# Patient Record
Sex: Male | Born: 1985 | Race: White | Hispanic: No | Marital: Single | Smoking: Current every day smoker
Health system: Southern US, Community
[De-identification: ages and names within clinical notes are randomized; demographics above are authoritative.]

---
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
## 2003-12-13 ENCOUNTER — Emergency Department (HOSPITAL_COMMUNITY): Admission: EM | Admit: 2003-12-13 | Discharge: 2003-12-13 | Payer: Self-pay | Admitting: Emergency Medicine

## 2003-12-25 ENCOUNTER — Encounter: Admission: RE | Admit: 2003-12-25 | Discharge: 2003-12-25 | Payer: Self-pay | Admitting: Orthopaedic Surgery

## 2004-08-09 ENCOUNTER — Inpatient Hospital Stay (HOSPITAL_COMMUNITY): Admission: EM | Admit: 2004-08-09 | Discharge: 2004-08-10 | Payer: Self-pay | Admitting: Podiatry

## 2006-03-27 ENCOUNTER — Emergency Department (HOSPITAL_COMMUNITY): Admission: EM | Admit: 2006-03-27 | Discharge: 2006-03-27 | Payer: Self-pay | Admitting: Emergency Medicine

## 2008-01-20 ENCOUNTER — Emergency Department (HOSPITAL_COMMUNITY): Admission: EM | Admit: 2008-01-20 | Discharge: 2008-01-20 | Payer: Self-pay | Admitting: Emergency Medicine
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
## 2010-07-31 NOTE — H&P (Signed)
NAME:  Travis Hardin, Travis Hardin                 ACCOUNT NO.:  [id number]   MEDICAL RECORD NO.:  [id number]          PATIENT TYPE:  INP   LOCATION:  3313                         FACILITY:  [facility]   PHYSICIAN:  Coletta Memos, M.D.     DATE OF BIRTH:  [birth year]   DATE OF ADMISSION:  08/09/2004  DATE OF DISCHARGE:                                HISTORY & PHYSICAL   Audio too short to transcribe (less than 5 seconds)      KC/MEDQ  D:  08/10/2004  T:  08/10/2004  Job:  [id number]
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
## 2013-05-07 ENCOUNTER — Emergency Department (HOSPITAL_COMMUNITY)
Admission: EM | Admit: 2013-05-07 | Discharge: 2013-05-07 | Disposition: A | Discharge status: Home / Self Care | Payer: Self-pay | Attending: Emergency Medicine | Admitting: Emergency Medicine

## 2013-05-07 ENCOUNTER — Encounter (HOSPITAL_COMMUNITY): Payer: Self-pay | Admitting: Emergency Medicine

## 2013-05-07 ENCOUNTER — Emergency Department (HOSPITAL_COMMUNITY): Payer: Self-pay

## 2013-05-07 DIAGNOSIS — F17200 Nicotine dependence, unspecified, uncomplicated: Secondary | ICD-10-CM | POA: Insufficient documentation

## 2013-05-07 DIAGNOSIS — IMO0002 Reserved for concepts with insufficient information to code with codable children: Secondary | ICD-10-CM

## 2013-05-07 DIAGNOSIS — Y929 Unspecified place or not applicable: Secondary | ICD-10-CM | POA: Insufficient documentation

## 2013-05-07 DIAGNOSIS — S81009A Unspecified open wound, unspecified knee, initial encounter: Secondary | ICD-10-CM | POA: Insufficient documentation

## 2013-05-07 DIAGNOSIS — W278XXA Contact with other nonpowered hand tool, initial encounter: Secondary | ICD-10-CM | POA: Insufficient documentation

## 2013-05-07 DIAGNOSIS — Y9389 Activity, other specified: Secondary | ICD-10-CM | POA: Insufficient documentation

## 2013-05-07 DIAGNOSIS — S81809A Unspecified open wound, unspecified lower leg, initial encounter: Principal | ICD-10-CM

## 2013-05-07 DIAGNOSIS — Z23 Encounter for immunization: Secondary | ICD-10-CM | POA: Insufficient documentation

## 2013-05-07 DIAGNOSIS — S91009A Unspecified open wound, unspecified ankle, initial encounter: Principal | ICD-10-CM

## 2013-05-07 MED ORDER — HYDROCODONE-ACETAMINOPHEN 5-325 MG PO TABS
1.0000 | ORAL_TABLET | Freq: Four times a day (QID) | ORAL | Status: AC | PRN
Start: 2013-05-07 — End: ?

## 2013-05-07 MED ORDER — CEPHALEXIN 500 MG PO CAPS: 500.0000 mg | ORAL_CAPSULE | Freq: Four times a day (QID) | ORAL | Status: AC

## 2013-05-07 MED ORDER — TETANUS-DIPHTH-ACELL PERTUSSIS 5-2.5-18.5 LF-MCG/0.5 IM SUSP
0.5000 mL | Freq: Once | INTRAMUSCULAR | Status: AC
  Administered 2013-05-07: 0.5 mL via INTRAMUSCULAR
  Filled 2013-05-07: qty 0.5

## 2013-05-07 MED ORDER — HYDROCODONE-ACETAMINOPHEN 5-325 MG PO TABS
2.0000 | ORAL_TABLET | Freq: Once | ORAL | Status: AC
  Administered 2013-05-07: 2 via ORAL
  Filled 2013-05-07: qty 2

## 2013-05-07 NOTE — ED Provider Notes (Signed)
Medical screening examination/treatment/procedure(s) were performed by non-physician practitioner and as supervising physician I was immediately available for consultation/collaboration.  EKG Interpretation   None         Marypat Kimmet L Rudra Hobbins, MD 05/07/13 1523 

## 2013-05-07 NOTE — Discharge Instructions (Signed)

## 2013-05-07 NOTE — ED Provider Notes (Signed)
CSN: [id number]     Arrival date & time 05/07/13  [id number] History   First MD Initiated Contact with Patient 05/07/13 [phone]     Chief Complaint  Patient presents with  . Extremity Laceration     (Consider location/radiation/quality/duration/timing/severity/associated sxs/prior Treatment) HPI Comments: Patient presents to the emergency department with chief complaint of laceration to his left knee. He states that he was using a chainsaw last night, when the saw bounced back and hit his left knee. He complains of a 2 inch laceration. Bleeding is controlled. States that he used Betadine and peroxide to clean the wound at home. States that this morning when he awoke, the knee pain was moderate to severe, so he decided to come in and be evaluated. Last tetanus shot is unknown. Bending the knee makes pain worse, rest makes it better. He has not tried taking anything to alleviate his symptoms.  The history is provided by the patient. No language interpreter was used.    History reviewed. No pertinent past medical history. History reviewed. No pertinent past surgical history. No family history on file. History  Substance Use Topics  . Smoking status: Current Every Day Smoker -- 0.50 packs/day    Types: Cigarettes  . Smokeless tobacco: Not on file  . Alcohol Use: Yes    Review of Systems  Constitutional: Negative for fever.  Musculoskeletal: Positive for arthralgias.  Skin: Positive for wound. Negative for color change, pallor and rash.      Allergies  Review of patient's allergies indicates no known allergies.  Home Medications  No current outpatient prescriptions on file. BP 129/75  Pulse 93  Temp(Src) 97.8 F (36.6 C) (Oral)  Resp 18  Ht 5\' 9"  (1.753 m)  Wt 165 lb (74.844 kg)  BMI 24.36 kg/m2  SpO2 100% Physical Exam  Nursing note and vitals reviewed. Constitutional: He is oriented to person, place, and time. He appears well-developed and well-nourished.  HENT:  Head:  Normocephalic and atraumatic.  Eyes: Conjunctivae and EOM are normal.  Neck: Normal range of motion.  Cardiovascular: Normal rate and intact distal pulses.   Pulmonary/Chest: Effort normal.  Abdominal: He exhibits no distension.  Musculoskeletal: Normal range of motion.  Left knee tender to palpation over the laceration, increased pain with range of motion, strength is 5/5  Neurological: He is alert and oriented to person, place, and time.  Skin: Skin is dry.  2-3 inch laceration to the left anterior knee, no foreign bodies, no obvious tendon involvement  Psychiatric: He has a normal mood and affect. His behavior is normal. Judgment and thought content normal.    ED Course  Procedures (including critical care time) Labs Review Labs Reviewed - No data to display Imaging Review No results found.  EKG Interpretation   None       MDM   Final diagnoses:  Laceration    Patient with a laceration to the left knee. The laceration happened approximately 13 hours ago. I will not close this with sutures due to increased risk of infection. I will apply a Steri-Strip, thoroughly irrigate the wound, and check a plain film. Will update patient's tetanus.  Plain films are negative. No tendon involvement. Wound was thoroughly cleansed. Antibiotic ointment applied. Return precautions are given. Discharge with Keflex.    Roxy Horsemanobert Nayara Taplin, PA-C 05/07/13 [phone]

## 2013-05-07 NOTE — ED Notes (Signed)
Patient states he was working last night and the chainsaw bounced back and hit his L knee.    L knee laceration approximately 2 inches long.   Patient states he used betadine and peroxide at home to clean the wound.

## 2013-07-13 DEATH — deceased

## 2014-12-01 IMAGING — CR DG KNEE COMPLETE 4+V*L*
5 series · 5 of 5 positions shown · non-contrast
Comparison: None.

CLINICAL DATA: Laceration anterior left knee 2 days ago.  Pain.

EXAM:
LEFT KNEE - COMPLETE 4+ VIEW

[x knee ap left]
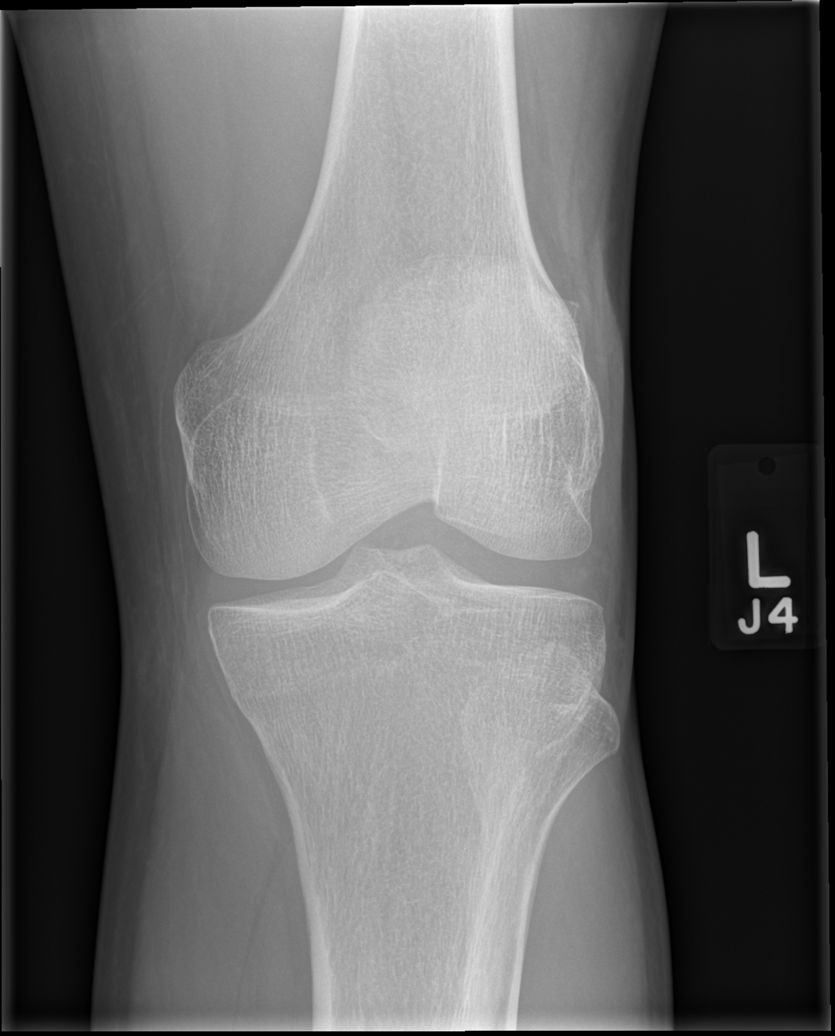

[x knee obl left (1 of 2)]
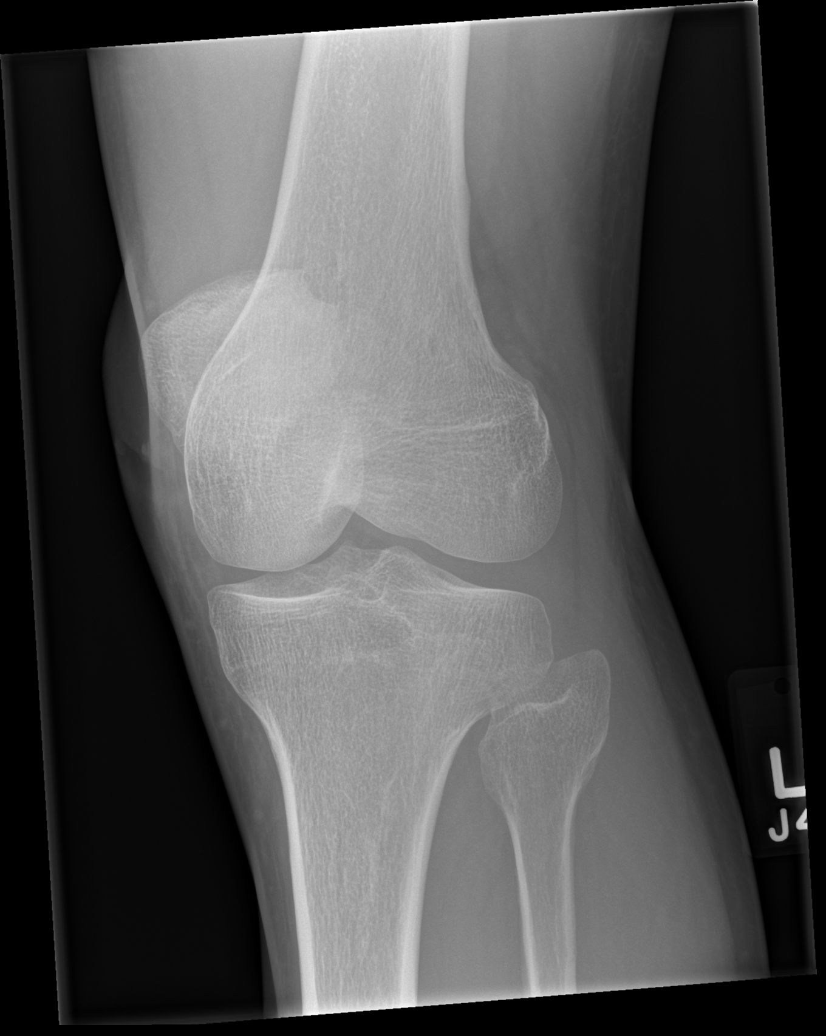

[x knee obl left (2 of 2)]
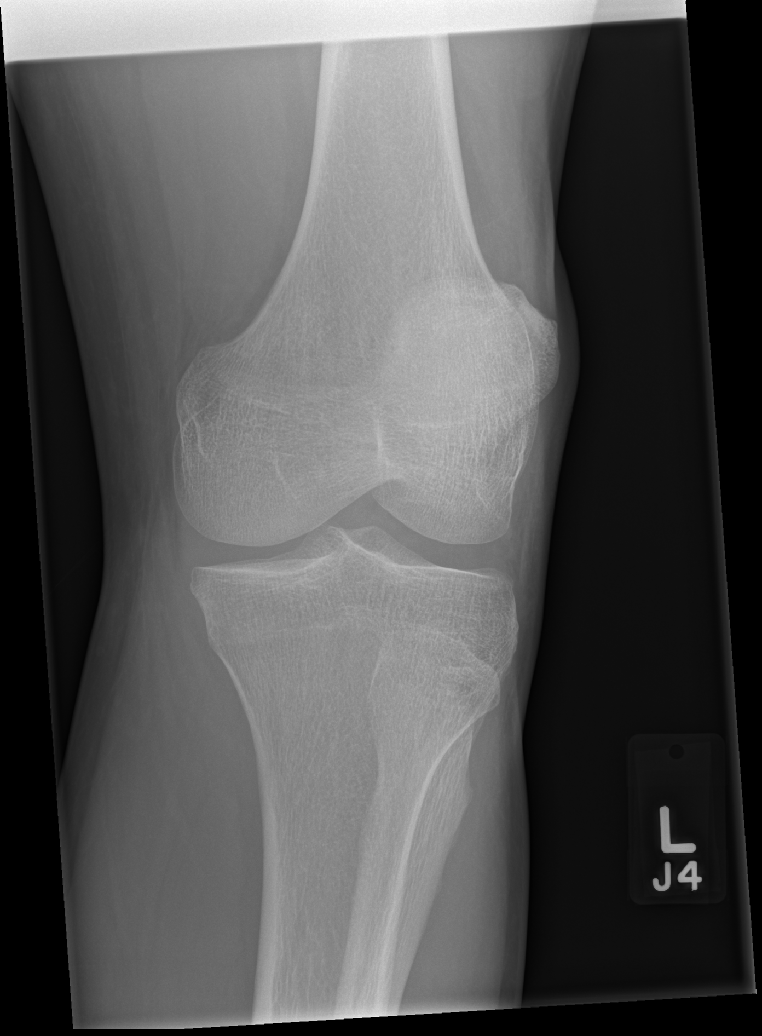

[x knee lat left (1 of 2)]
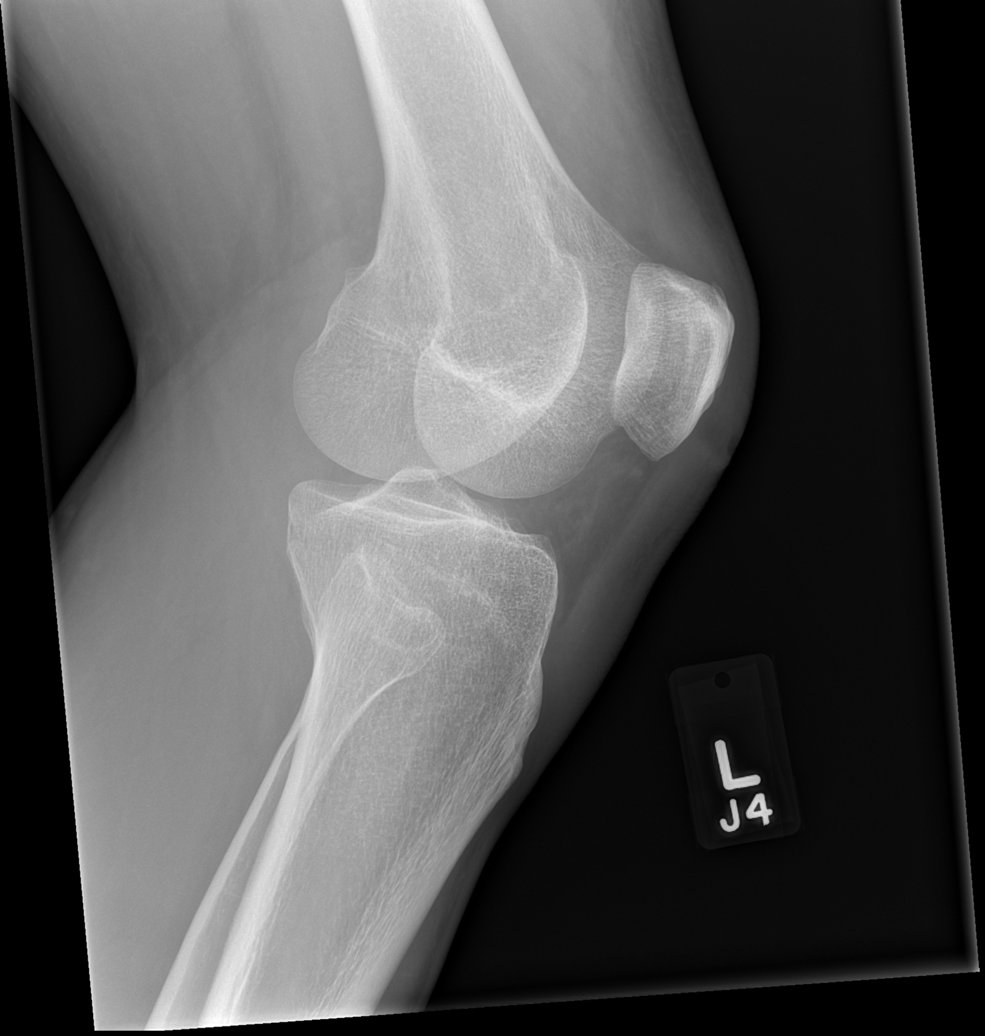

[x knee lat left (2 of 2)]
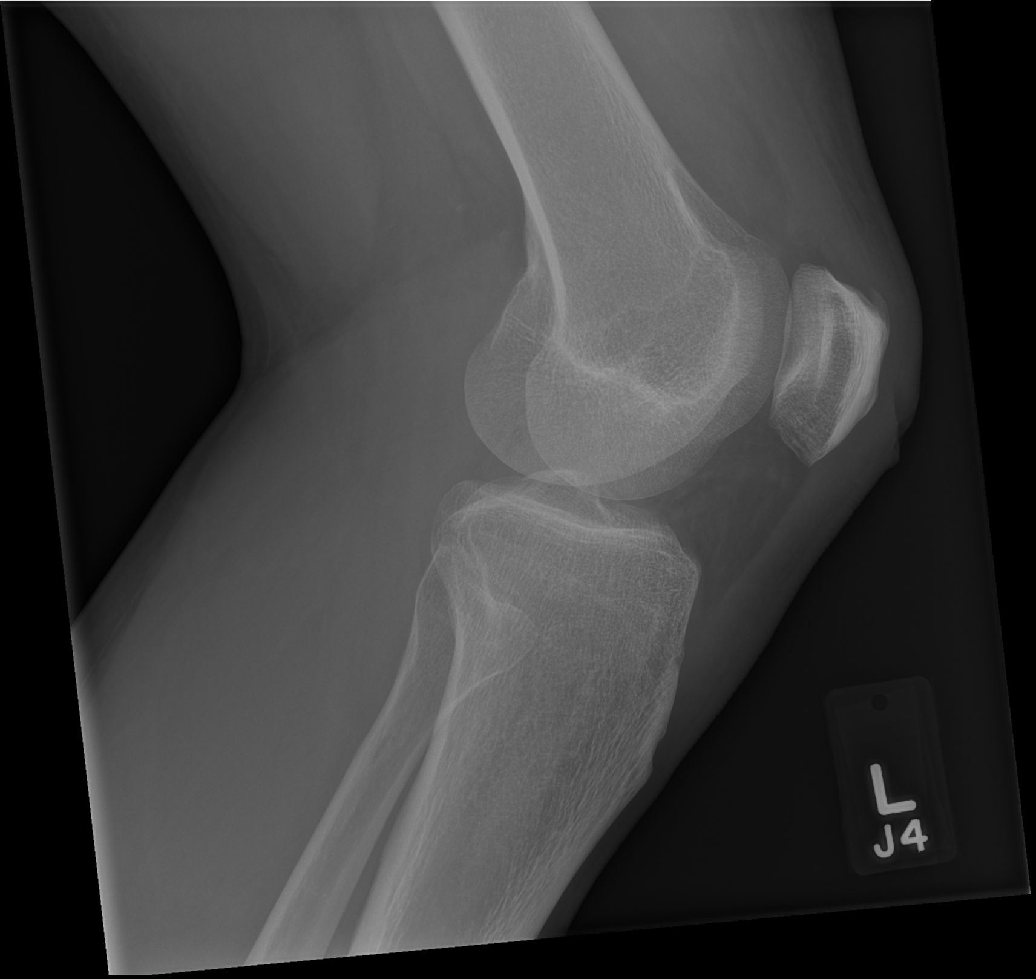

[5 of 5 positions shown; findings below may reference images not displayed]

FINDINGS: A skin defect is seen over the inferior pole of the patella
consistent with laceration. No fracture or foreign body is
identified. No joint effusion is seen.
IMPRESSION: Laceration anterior knee without underlying fracture or foreign
body.
